# Patient Record
Sex: Female | Born: 1975 | Race: White | Hispanic: Yes
Health system: Southern US, Community
[De-identification: ages and names within clinical notes are randomized; demographics above are authoritative.]

---

## 2015-09-21 ENCOUNTER — Other Ambulatory Visit: Payer: Self-pay | Admitting: Obstetrics & Gynecology

## 2015-09-21 DIAGNOSIS — R928 Other abnormal and inconclusive findings on diagnostic imaging of breast: Secondary | ICD-10-CM

## 2015-09-27 ENCOUNTER — Ambulatory Visit
Admission: RE | Admit: 2015-09-27 | Discharge: 2015-09-27 | Disposition: A | Discharge status: Home / Self Care | Payer: Managed Care, Other (non HMO) | Source: Ambulatory Visit | Attending: Obstetrics & Gynecology | Admitting: Obstetrics & Gynecology

## 2015-09-27 DIAGNOSIS — R928 Other abnormal and inconclusive findings on diagnostic imaging of breast: Secondary | ICD-10-CM

## 2017-12-02 ENCOUNTER — Emergency Department (HOSPITAL_COMMUNITY): Payer: Managed Care, Other (non HMO)

## 2017-12-02 ENCOUNTER — Emergency Department (HOSPITAL_COMMUNITY)
Admission: EM | Admit: 2017-12-02 | Discharge: 2017-12-02 | Disposition: A | Discharge status: Home / Self Care | Payer: Managed Care, Other (non HMO) | Attending: Emergency Medicine | Admitting: Emergency Medicine

## 2017-12-02 ENCOUNTER — Other Ambulatory Visit: Payer: Self-pay

## 2017-12-02 DIAGNOSIS — S43014A Anterior dislocation of right humerus, initial encounter: Secondary | ICD-10-CM | POA: Diagnosis not present

## 2017-12-02 DIAGNOSIS — Y999 Unspecified external cause status: Secondary | ICD-10-CM | POA: Insufficient documentation

## 2017-12-02 DIAGNOSIS — Y92812 Truck as the place of occurrence of the external cause: Secondary | ICD-10-CM | POA: Insufficient documentation

## 2017-12-02 DIAGNOSIS — S4991XA Unspecified injury of right shoulder and upper arm, initial encounter: Secondary | ICD-10-CM | POA: Diagnosis present

## 2017-12-02 DIAGNOSIS — X58XXXA Exposure to other specified factors, initial encounter: Secondary | ICD-10-CM | POA: Insufficient documentation

## 2017-12-02 DIAGNOSIS — Y939 Activity, unspecified: Secondary | ICD-10-CM | POA: Diagnosis not present

## 2017-12-02 DIAGNOSIS — S43006A Unspecified dislocation of unspecified shoulder joint, initial encounter: Secondary | ICD-10-CM

## 2017-12-02 MED ORDER — ETOMIDATE 2 MG/ML IV SOLN: 10.0000 mg | Freq: Once | INTRAVENOUS | Status: DC

## 2017-12-02 MED ORDER — ONDANSETRON HCL 4 MG/2ML IJ SOLN: 4.0000 mg | Freq: Once | INTRAMUSCULAR | Status: DC

## 2017-12-02 MED ORDER — MIDAZOLAM HCL 2 MG/2ML IJ SOLN: 1.0000 mg | Freq: Once | INTRAMUSCULAR | Status: DC

## 2017-12-02 MED ORDER — MORPHINE SULFATE (PF) 4 MG/ML IV SOLN: 4.0000 mg | Freq: Once | INTRAVENOUS | Status: DC

## 2017-12-02 MED ORDER — METHOCARBAMOL 500 MG PO TABS: 500.0000 mg | ORAL_TABLET | Freq: Two times a day (BID) | ORAL | 0 refills | Status: AC

## 2017-12-02 MED ORDER — OXYCODONE-ACETAMINOPHEN 5-325 MG PO TABS: 1.0000 | ORAL_TABLET | ORAL | 0 refills | Status: AC | PRN

## 2017-12-02 MED ORDER — OXYCODONE-ACETAMINOPHEN 5-325 MG PO TABS
1.0000 | ORAL_TABLET | Freq: Once | ORAL | Status: AC
  Administered 2017-12-02: 1 via ORAL
  Filled 2017-12-02: qty 1

## 2017-12-02 NOTE — ED Provider Notes (Signed)
[facility]-EMERGENCY DEPT Provider Note   CSN: [id number] Arrival date & time: 12/02/17  1650     History   Chief Complaint Chief Complaint  Patient presents with  . Shoulder Injury    R    HPI Caitlyn Adams is a 42 y.o. female.  Caitlyn Adams is a 42 y.o. Female with hx of prior R. Shoulder dislocation, otherwise healthy, presents to ED for evaluation of R shoulder pain and deformity. Pt reports she was getting out of her SUV and felt like her shoulder pooped out of place, since then she has had constant, worsening pain and is unable to move the shoulder. Pt notes anterior shoulder deformity. Pt reports decreased sensation throughout th R arm, but denies complete numbness. No other injuries or complaints reported. No meds PTA. Pt reports she has local orthopedist Dr. Jillyn HiddenBean, who saw he after previous dislocation, no hx of shoulder surgeries.      No past medical history on file.  There are no active problems to display for this patient.    OB History    No data available       Home Medications    Prior to Admission medications   Not on File    Family History No family history on file.  Social History Social History   Tobacco Use  . Smoking status: Not on file  Substance Use Topics  . Alcohol use: Not on file  . Drug use: Not on file     Allergies   Patient has no known allergies.   Review of Systems Review of Systems  Constitutional: Negative for chills and fever.  Respiratory: Negative for shortness of breath.   Cardiovascular: Negative for chest pain.  Gastrointestinal: Negative for abdominal pain, nausea and vomiting.  Genitourinary: Negative for dysuria and frequency.  Musculoskeletal: Positive for arthralgias.  Skin: Negative for rash and wound.  Neurological: Negative for weakness and numbness.  All other systems reviewed and are negative.    Physical Exam Updated Vital Signs BP 124/62 (BP Location: Left Arm)   Pulse  (!) 54   Temp 98.1 F (36.7 C) (Oral)   Resp 18   Wt 77.1 kg (170 lb)   SpO2 99%   Physical Exam  Constitutional: She appears well-developed and well-nourished. No distress.  HENT:  Head: Normocephalic and atraumatic.  Eyes: Right eye exhibits no discharge. Left eye exhibits no discharge.  Cardiovascular: Normal rate, regular rhythm and normal heart sounds.  Pulmonary/Chest: Effort normal and breath sounds normal. No stridor. No respiratory distress. She has no wheezes. She has no rales.  Abdominal: Soft. Bowel sounds are normal. She exhibits no distension. There is no tenderness.  Musculoskeletal:  Obvious deformity of anterior shoulder, tenderness to palpation, radial pulse 2+ with good capillary refill, sensation slightly decreased throughout arm but motor function intact, pt able to make ok sign, thumbs up and spread fingers, grip strength 5/5.  Neurological: She is alert. Coordination normal.  Skin: She is not diaphoretic.  Psychiatric: She has a normal mood and affect. Her behavior is normal.  Nursing note and vitals reviewed.    ED Treatments / Results  Labs (all labs ordered are listed, but only abnormal results are displayed) Labs Reviewed - No data to display  EKG  EKG Interpretation None       Radiology Dg Shoulder Right  Result Date: 12/02/2017 CLINICAL DATA:  Pain following sudden movement EXAM: RIGHT SHOULDER - 2+ VIEW COMPARISON:  None. FINDINGS: Frontal and Y  scapular images were obtained. There is a subcoracoid anterior dislocation. No fracture. Joint spaces appear unremarkable. There is mild atelectasis in the right lung base. IMPRESSION: Subcoracoid anterior shoulder dislocation on the right. No fracture or appreciable arthropathy. Electronically Signed   By: Bretta Bang III M.D.   On: 12/02/2017 17:48   Dg Shoulder Right Port  Result Date: 12/02/2017 CLINICAL DATA:  Shoulder dislocation.  Spontaneous reduction. EXAM: PORTABLE RIGHT SHOULDER  COMPARISON:  Two-view right shoulder from the same day. FINDINGS: The right shoulder is now located.  No fracture is evident. IMPRESSION: Reduction of right shoulder without evidence for fracture. Electronically Signed   By: Marin Roberts M.D.   On: 12/02/2017 18:33    Procedures Procedures (including critical care time)  Medications Ordered in ED Medications  oxyCODONE-acetaminophen (PERCOCET/ROXICET) 5-325 MG per tablet 1 tablet (1 tablet Oral Given 12/02/17 1901)     Initial Impression / Assessment and Plan / ED Course  I have reviewed the triage vital signs and the nursing notes.  Pertinent labs & imaging results that were available during my care of the patient were reviewed by me and considered in my medical decision making (see chart for details).  Pt presents with right anterior shoulder dislocation, confirmed with XR, right upper extremity is neurovascularly intact with mild decreased sensation. Will plan for conscious sedation and reduction of the shoulder.  While pt was getting change into gown shoulder reduced on its own, confirmed with post-reduction film. R. Arm with continued good pulses, motor function and sensation has returned to normal. Pt placed in sling immobilizer and provided with PO pain meds. Pt stable for discharge home, will follow up with her orthopedist Dr. Jillyn Hidden. Provided scripts for pain meds and muscle relaxers, counseled pt on using ice as well. Strict return precautions discussed, pt expresses understanding and is in agreement with plan.  Patient discussed with Dr. Particia Nearing, who saw patient as well and agrees with plan.   Final Clinical Impressions(s) / ED Diagnoses   Final diagnoses:  Shoulder dislocation  Anterior dislocation of right shoulder, initial encounter    ED Discharge Orders        Ordered    oxyCODONE-acetaminophen (PERCOCET) 5-325 MG tablet  Every 4 hours PRN     12/02/17 1845    methocarbamol (ROBAXIN) 500 MG tablet  2 times  daily     12/02/17 1845       Legrand Rams 12/02/17 2012    Jacalyn Lefevre, MD 12/02/17 2014

## 2017-12-02 NOTE — ED Notes (Signed)
Bed: WA21 Expected date:  Expected time:  Means of arrival:  Comments: TR2 

## 2017-12-02 NOTE — Discharge Instructions (Signed)
Your shoulder is back in place, please remain in sling until you follow up with Dr. Jillyn HiddenBean with orthopedics. You may use NSAIDs, percocet and robaxin as needed for pain, these medications can make you drowsy.  If you feel your shoulder has come back out of place or you have significantly worsened pain, numbness or weakness please return to the emergency department.

## 2017-12-02 NOTE — ED Triage Notes (Addendum)
Pt reports that she was getting out of her lifted [employer] and noticed her R shoulder felt like it popped out of place. She states that it has been dislocated before. Deformity noted on anterior side of shoulder.  A&Ox4.

## 2019-01-22 IMAGING — CR DG SHOULDER 2+V*R*
2 series · 2 of 2 positions shown · non-contrast
Comparison: None.

CLINICAL DATA: Pain following sudden movement

EXAM:
RIGHT SHOULDER - 2+ VIEW

[w shoulder external right]
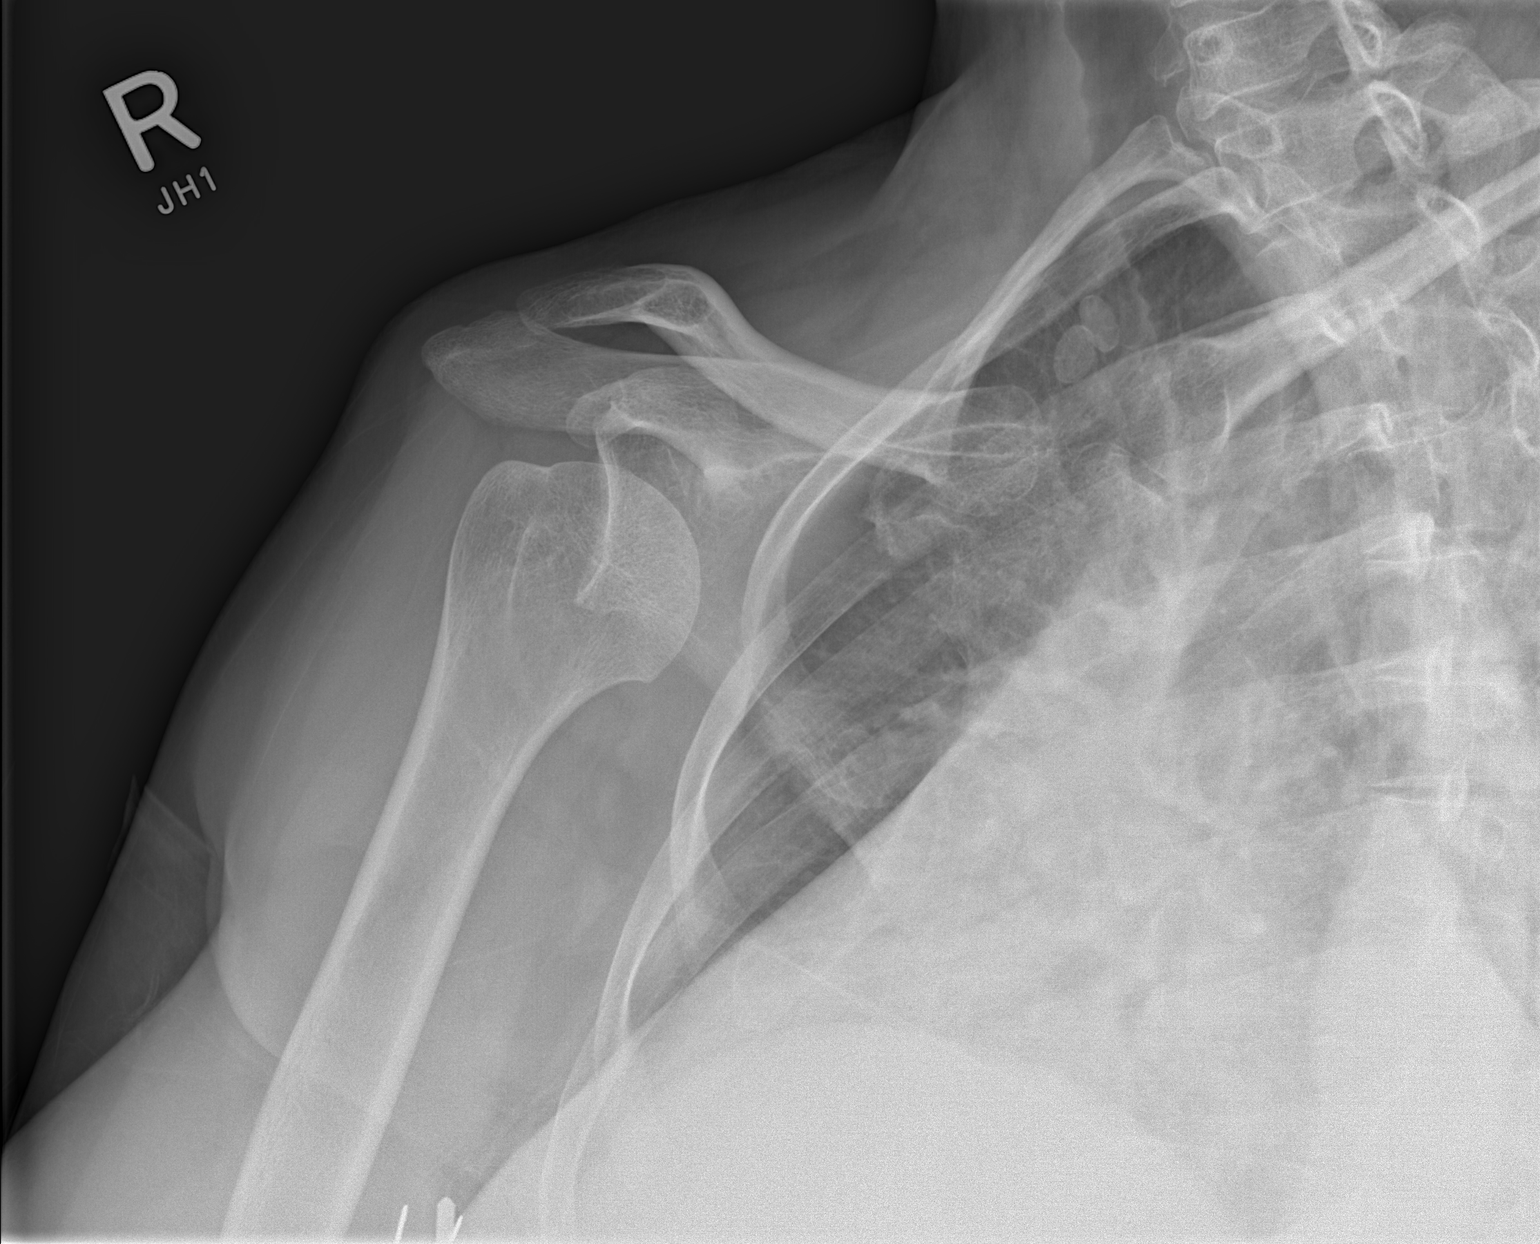

[w shoulder y-view right]
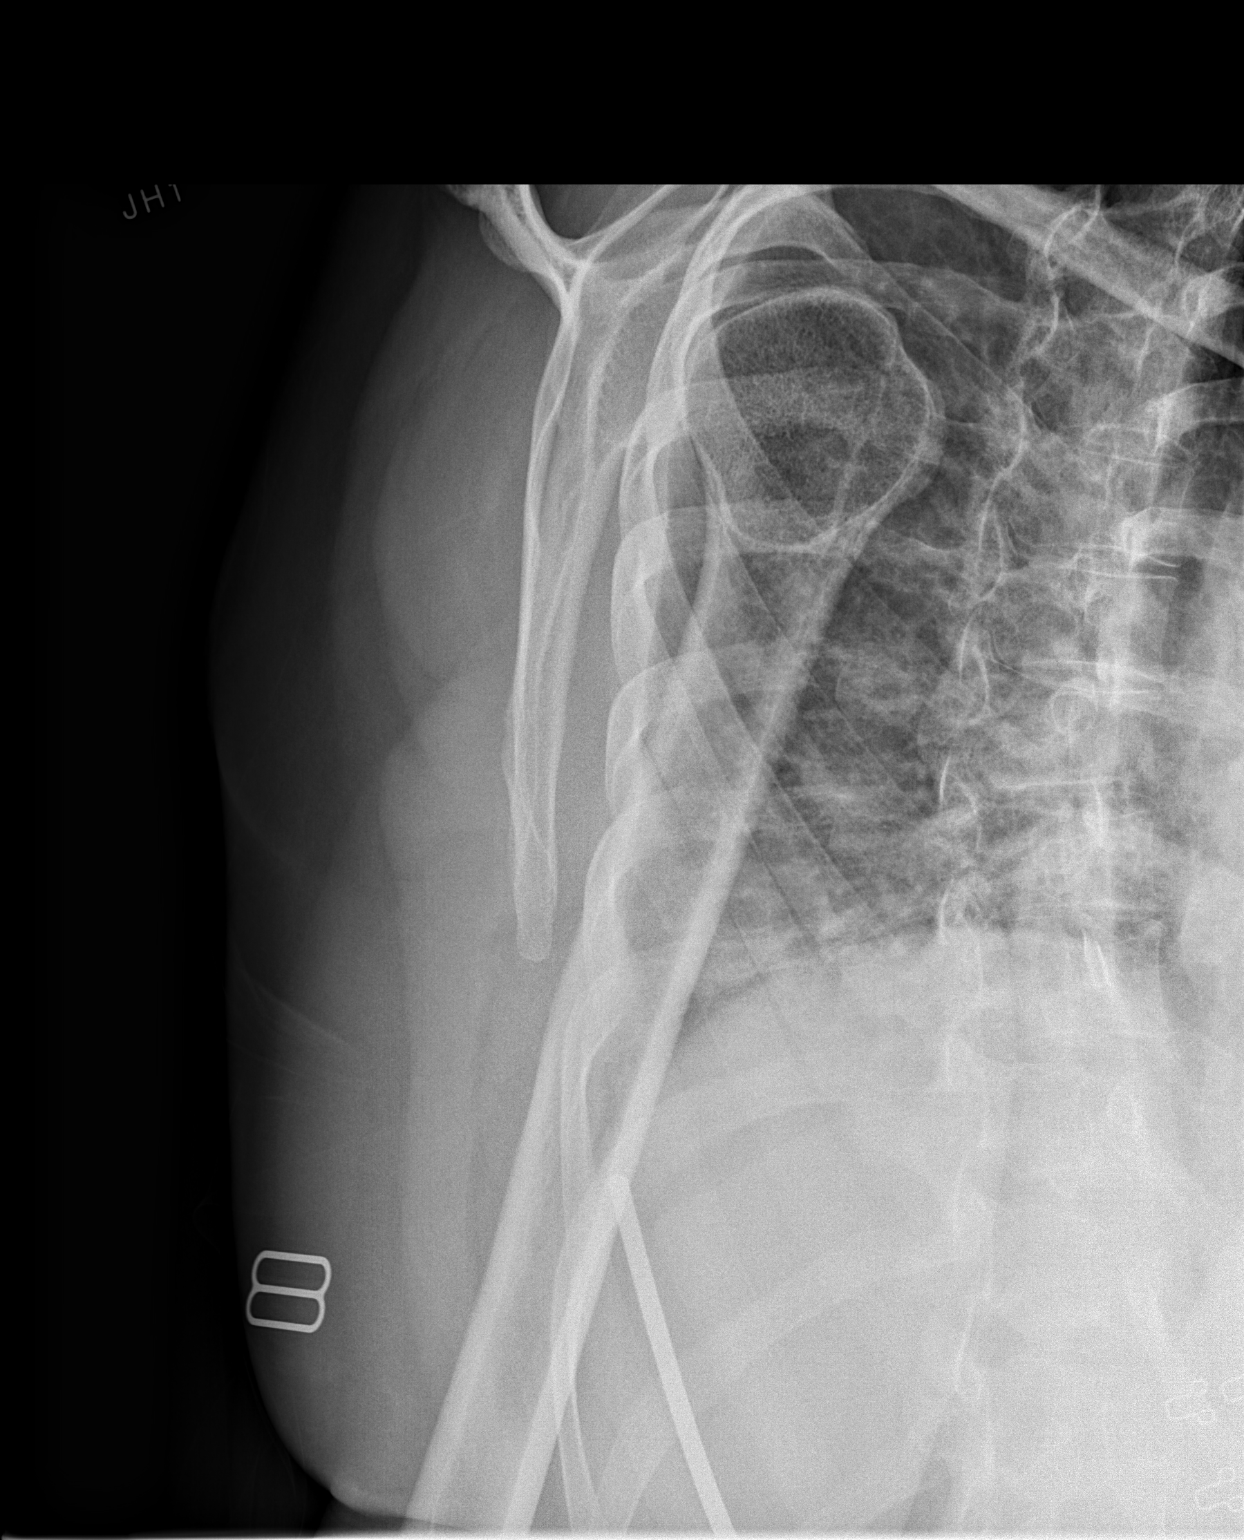

[2 of 2 positions shown; findings below may reference images not displayed]

FINDINGS: Frontal and Y scapular images were obtained. There is a subcoracoid
anterior dislocation. No fracture. Joint spaces appear unremarkable.
There is mild atelectasis in the right lung base.
IMPRESSION: Subcoracoid anterior shoulder dislocation on the right. No fracture
or appreciable arthropathy.

## 2019-01-22 IMAGING — DX DG SHOULDER 2+V PORT*R*
2 series · 2 of 2 positions shown · non-contrast
Comparison: Two-view right shoulder from the same day.

CLINICAL DATA: Shoulder dislocation.  Spontaneous reduction.

EXAM:
PORTABLE RIGHT SHOULDER

[shoulder ap]
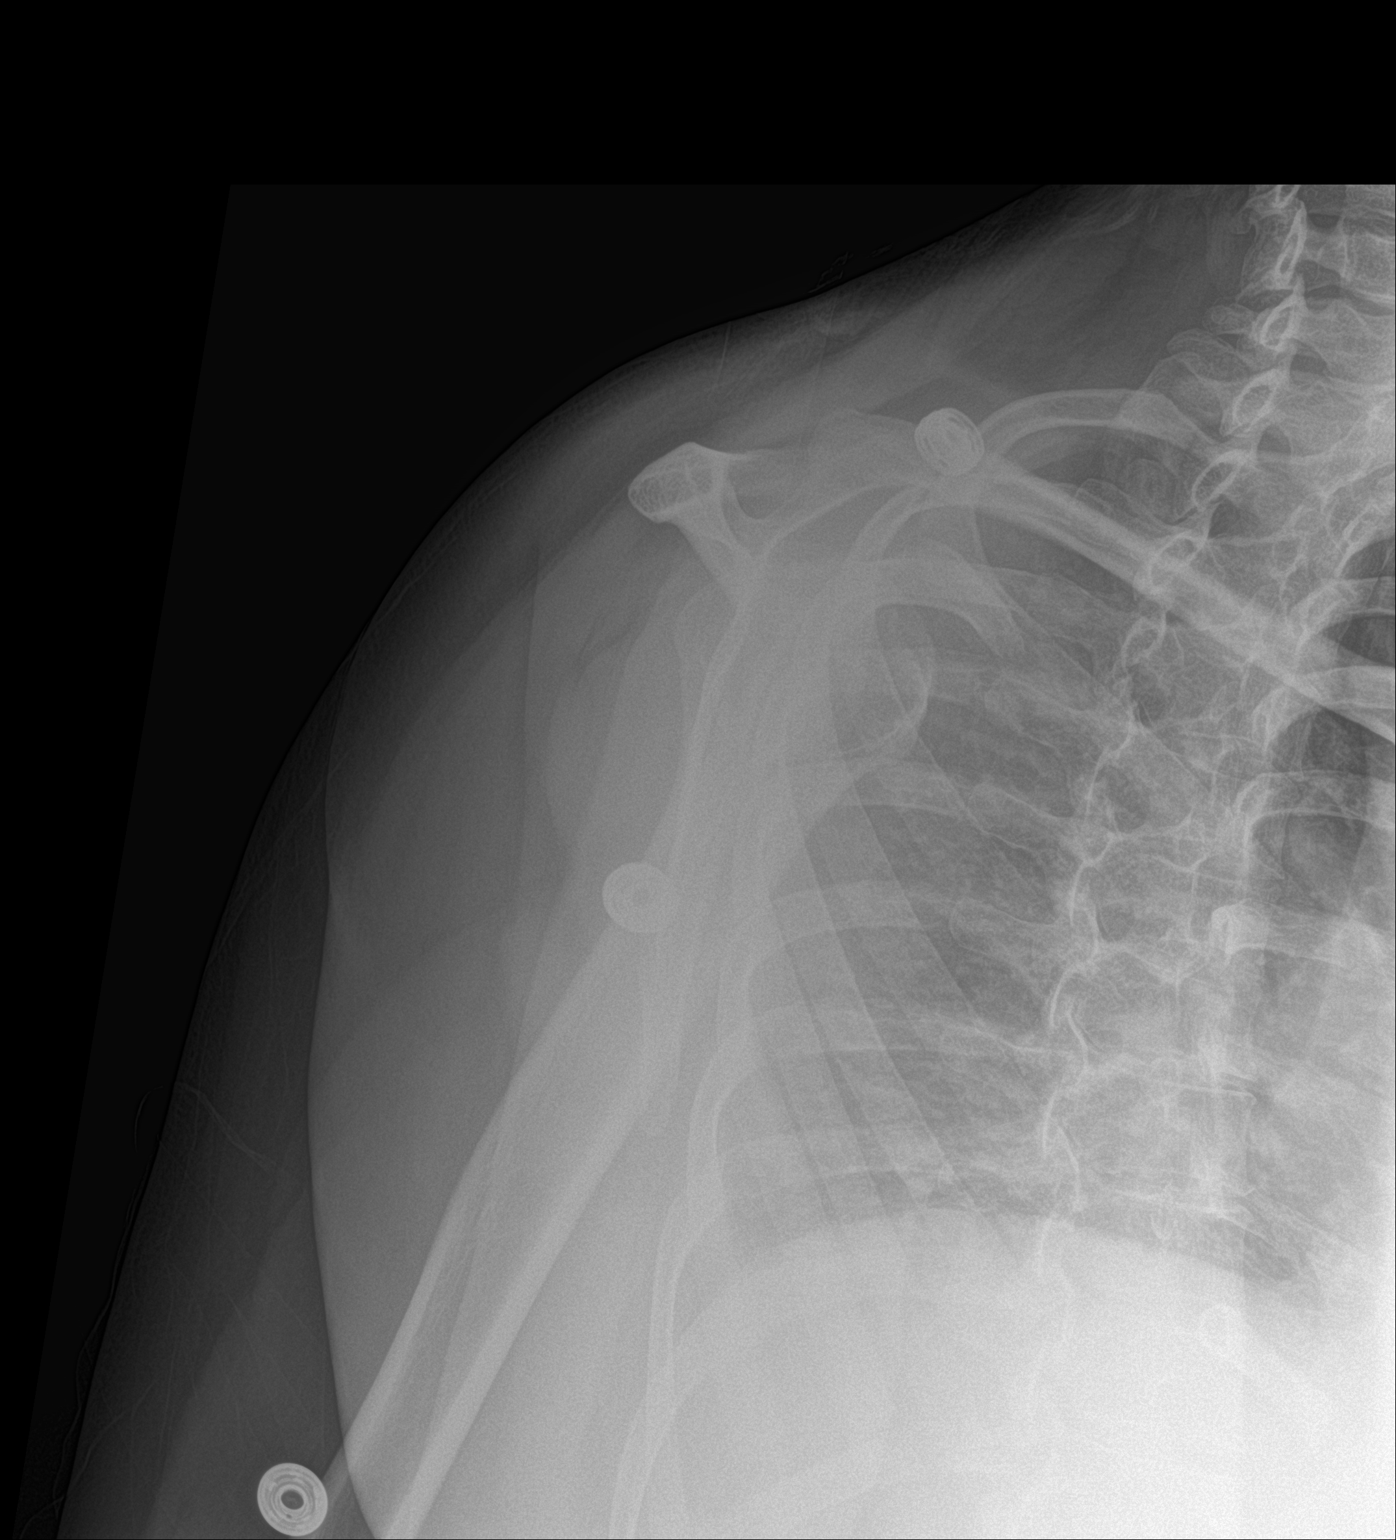

[shoulder axial]
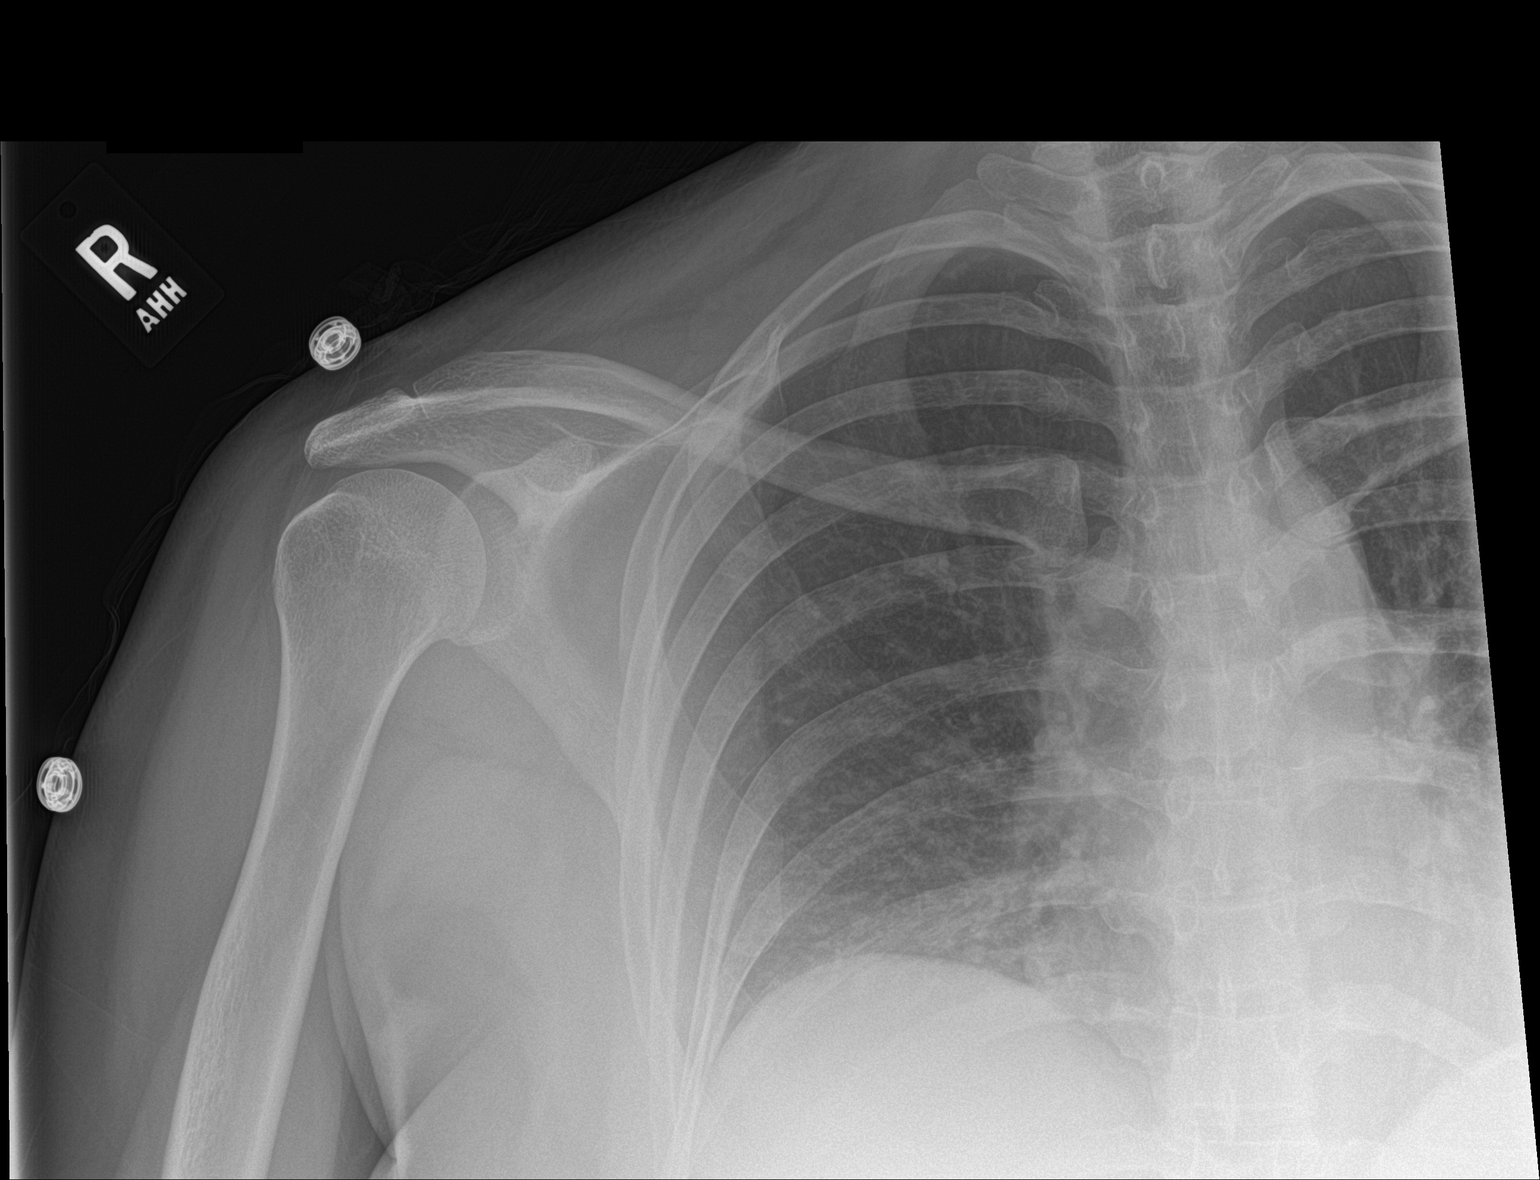

[2 of 2 positions shown; findings below may reference images not displayed]

FINDINGS: The right shoulder is now located.  No fracture is evident.
IMPRESSION: Reduction of right shoulder without evidence for fracture.
